# Patient Record
Sex: Male | Born: 1975 | Race: White | Hispanic: No | Marital: Married | Smoking: Never smoker
Health system: Southern US, Community
[De-identification: ages and names within clinical notes are randomized; demographics above are authoritative.]

## PROBLEM LIST (undated history)

## (undated) HISTORY — PX: TONSILLECTOMY: SUR1361

## (undated) HISTORY — PX: WISDOM TOOTH EXTRACTION: SHX21

---
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
## 1998-03-24 ENCOUNTER — Inpatient Hospital Stay (HOSPITAL_COMMUNITY): Admission: EM | Admit: 1998-03-24 | Discharge: 1998-03-24 | Payer: Self-pay

## 1998-03-24 ENCOUNTER — Inpatient Hospital Stay (HOSPITAL_COMMUNITY): Admission: AD | Admit: 1998-03-24 | Discharge: 1998-03-25 | Payer: Self-pay | Admitting: Psychiatry

## 2000-01-01 HISTORY — PX: OTHER SURGICAL HISTORY: SHX169

## 2001-05-30 ENCOUNTER — Ambulatory Visit (HOSPITAL_COMMUNITY): Admission: RE | Admit: 2001-05-30 | Discharge: 2001-05-30 | Payer: Self-pay | Admitting: Urology

## 2005-06-04 ENCOUNTER — Emergency Department (HOSPITAL_COMMUNITY): Admission: EM | Admit: 2005-06-04 | Discharge: 2005-06-05 | Payer: Self-pay | Admitting: Emergency Medicine

## 2012-09-24 ENCOUNTER — Telehealth (INDEPENDENT_AMBULATORY_CARE_PROVIDER_SITE_OTHER): Payer: Self-pay

## 2012-09-24 NOTE — Telephone Encounter (Signed)
Called [facility] to request medical records on patient for upcoming appointment on 09/25/12 @ 9:45 w/Dr. Andrey Campanile.  Medical records will fax to our office

## 2012-09-25 ENCOUNTER — Ambulatory Visit (INDEPENDENT_AMBULATORY_CARE_PROVIDER_SITE_OTHER): Payer: PRIVATE HEALTH INSURANCE | Admitting: General Surgery

## 2012-09-25 ENCOUNTER — Encounter (INDEPENDENT_AMBULATORY_CARE_PROVIDER_SITE_OTHER): Payer: Self-pay | Admitting: General Surgery

## 2012-09-25 VITALS — BP 114/85 | HR 80 | Temp 98.0°F | Resp 14 | Ht 69.0 in | Wt 184.8 lb

## 2012-09-25 DIAGNOSIS — R1031 Right lower quadrant pain: Secondary | ICD-10-CM

## 2012-09-25 DIAGNOSIS — M549 Dorsalgia, unspecified: Secondary | ICD-10-CM

## 2012-09-25 NOTE — Patient Instructions (Signed)
We will refer you to [facility]   Inguinal Strain Your exam shows you have an inguinal strain. This is also known as a pulled groin. This injury is usually due to a pull or partial tear to a muscle or tendon in the groin area. Most groin pulls take several weeks to heal completely. There may be pain with lifting your leg or walking during much of your recovery. Treatment for groin strains includes:  Rest and avoid lifting or performing activities that increase your pain.  Apply ice packs for 20-30 minutes every few hours to reduce pain and swelling over the next 2-3 days.  Medicine to reduce pain and inflammation is often prescribed. HOME CARE INSTRUCTIONS  While most strains in the groin area will heal with rest, you should also watch for any signs of a more serious condition.  SEEK IMMEDIATE MEDICAL CARE IF:   You notice unusual swelling or bulging in the groin.  You have pain or swelling in the testicle.  Blood in your urine.  Marked increased pain.  Weakness or numbness of your leg or abdominal pain. MAKE SURE YOU:   Understand these instructions.  Will watch your condition.  Will get help right away if you are not doing well or get worse. Document Released: 04/26/2004 Document Revised: 06/11/2011 Document Reviewed: 07/24/2007 [facility] Patient Information 2014 [place], [place].

## 2012-09-25 NOTE — Progress Notes (Signed)
Patient ID: Perry Cooper, male   DOB: [birth year], 37 y.o.   MRN: [id number]  Chief Complaint  Patient presents with  . New Evaluation    eval RIH    HPI Perry Cooper is a 37 y.o. male.   HPI 37 year old Caucasian male referred by Dr. Barbaraann Barthel for evaluation of right groin pain. The patient states that he was actually referred about a month ago but he has been putting it off. He states his problems began actually about 3 months ago now when he was at the gym working out. He is an amateur bodybuilder. He states he was doing a dead lift when he felt acute pain in his right lower back. He described it as a popping sensation in his right lower back. He immediately developed right groin pain as well as right lower leg numbness on his anterior medial thigh radiating down to his ankle. He thought that he had pulled a muscle or injured his sciatic nerve and he saw a chiropractor on several occasions without any relief. During this time he continued to go to the gym although he could not lift as much. He eventually saw his primary care physician in mid-May he put him on a prednisone taper and it felt at that time that he may have a right inguinal hernia. He states that the prednisone helped a little bit. However he is still symptomatic. He describes it a pushing sensation in his right groin. He states it is also a burning and stinging sensation as well. He denies noticing a bulge. He also complains of ongoing right lower back and hip pain as well. He states this is particularly bothersome when he tries to do any type of lifting. He states that he cannot do any squats or leg presses because of discomfort in his right hip and right groin area. He denies any dysuria. He denies any fever, chills, diarrhea or constipation. He still has some numbness as well as weakness in his right lower extremity. He states that he really hasn't done any strenuous activity or weightlifting in the past 2 weeks.    History reviewed. No pertinent past medical history.  Past Surgical History  Procedure Laterality Date  . Inner ear surgery Left 12/1984  . Testicular   01/2000    Family History  Problem Relation Age of Onset  . Cancer Paternal Grandfather     Colon    Social History History  Substance Use Topics  . Smoking status: Never Smoker   . Smokeless tobacco: Not on file  . Alcohol Use: Yes     Comment: Once a month    No Known Allergies  Current Outpatient Prescriptions  Medication Sig Dispense Refill  . naproxen (NAPROSYN) 500 MG tablet Take 500 mg by mouth 2 (two) times daily with a meal.       No current facility-administered medications for this visit.    Review of Systems Review of Systems  Constitutional: Negative for fever, chills, appetite change and unexpected weight change.  HENT: Negative for congestion and trouble swallowing.   Eyes: Negative for visual disturbance.  Respiratory: Negative for chest tightness and shortness of breath.   Cardiovascular: Negative for chest pain and leg swelling.       No PND, no orthopnea, no DOE  Gastrointestinal:       See HPI  Genitourinary: Positive for flank pain. Negative for dysuria, urgency, hematuria, decreased urine volume, discharge, scrotal swelling and difficulty urinating.  Musculoskeletal: Positive for back  pain.       See hpi  Skin: Negative for rash.  Neurological: Positive for numbness. Negative for dizziness, seizures, facial asymmetry and speech difficulty.  Hematological: Does not bruise/bleed easily.  Psychiatric/Behavioral: Negative for behavioral problems and confusion.    Blood pressure 114/85, pulse 80, temperature 98 F (36.7 C), temperature source Temporal, resp. rate 14, height 5\' 9"  (1.753 m), weight 184 lb 12.8 oz (83.825 kg), SpO2 98.00%.  Physical Exam Physical Exam  Constitutional: He is oriented to person, place, and time. He appears well-developed and well-nourished. No distress.   Muscular  HENT:  Head: Normocephalic and atraumatic.  Right Ear: External ear normal.  Left Ear: External ear normal.  Eyes: Conjunctivae are normal. No scleral icterus.  Neck: Normal range of motion. Neck supple. No tracheal deviation present. No thyromegaly present.  Cardiovascular: Normal rate, regular rhythm and normal heart sounds.   Pulmonary/Chest: Effort normal and breath sounds normal. No respiratory distress. He has no wheezes.  Abdominal: Soft. He exhibits no distension. There is no tenderness. There is no rebound and no guarding. Hernia confirmed negative in the right inguinal area and confirmed negative in the left inguinal area.  Genitourinary: Penis normal.    Right testis is descended. Left testis is descended. No penile tenderness.  Pt examined supine/standing with & without valsalva maneuvers; supine with leg raise, supine with abd situp - no bulge, no hernia on left or right; symmetric; no significant bulge with palpation of inguinal ring by myself or Dr Derrell Lolling; some minor pubic hair irritation from trimming area- no cellulitis  Musculoskeletal: He exhibits no edema and no tenderness.  Strength for the most part is symmetric although there may be a subtle weakness in the right lower extremity compared to left lower extremity  Lymphadenopathy:       Right: No inguinal adenopathy present.       Left: No inguinal adenopathy present.  Neurological: He is alert and oriented to person, place, and time.  Skin: Skin is warm and dry. No rash noted. He is not diaphoretic. No erythema.  Psychiatric: He has a normal mood and affect. His behavior is normal. Judgment and thought content normal.    Data Reviewed Dr Barbaraann Barthel office note  Assessment    Right groin pain Right lower back and hip pain     Plan    I did not appreciate an inguinal hernia on exam. I had one of my partners Dr. Derrell Lolling also examine the patient as well and he did not appreciate an inguinal hernia as  well. However it is possible he may have a very small early hernia. Nonetheless an inguinal hernia does not produce lower back pain with numbness down the lower extremity. He more than likely has inguinal strain in addition to some type of lower back versus hip issue. The patient was given information about inguinal strain. We discussed the importance of avoiding heavy lifting, repetitive motion, ice packs, and NSAIDs. We will refer the patient to [facility] orthopedics for evaluation as well with respect to his right lower back and hip pain with numbness down his lower extremity. F/u prn  Mary Sella. Andrey Campanile, MD, FACS General, Bariatric, & Minimally Invasive Surgery [facility], [place]         [facility] 09/25/2012, 11:05 AM

## 2012-09-30 ENCOUNTER — Ambulatory Visit (INDEPENDENT_AMBULATORY_CARE_PROVIDER_SITE_OTHER): Payer: Managed Care, Other (non HMO) | Admitting: General Surgery

## 2016-04-14 ENCOUNTER — Emergency Department (HOSPITAL_COMMUNITY)
Admission: EM | Admit: 2016-04-14 | Discharge: 2016-04-14 | Disposition: A | Discharge status: Home / Self Care | Payer: BLUE CROSS/BLUE SHIELD | Attending: Emergency Medicine | Admitting: Emergency Medicine

## 2016-04-14 ENCOUNTER — Encounter (HOSPITAL_COMMUNITY): Payer: Self-pay | Admitting: Emergency Medicine

## 2016-04-14 ENCOUNTER — Emergency Department (HOSPITAL_COMMUNITY): Payer: BLUE CROSS/BLUE SHIELD

## 2016-04-14 DIAGNOSIS — M7989 Other specified soft tissue disorders: Secondary | ICD-10-CM

## 2016-04-14 DIAGNOSIS — M546 Pain in thoracic spine: Secondary | ICD-10-CM | POA: Diagnosis present

## 2016-04-14 DIAGNOSIS — M799 Soft tissue disorder, unspecified: Secondary | ICD-10-CM | POA: Diagnosis not present

## 2016-04-14 MED ORDER — IBUPROFEN 800 MG PO TABS: 800.0000 mg | ORAL_TABLET | Freq: Three times a day (TID) | ORAL | 0 refills | Status: AC | PRN

## 2016-04-14 MED ORDER — METHOCARBAMOL 500 MG PO TABS: 500.0000 mg | ORAL_TABLET | Freq: Four times a day (QID) | ORAL | 0 refills | Status: AC | PRN

## 2016-04-14 NOTE — Discharge Instructions (Signed)
Read the information below.  Use the prescribed medication as directed.  Please discuss all new medications with your pharmacist.  You may return to the Emergency Department at any time for worsening condition or any new symptoms that concern you.    °

## 2016-04-14 NOTE — ED Triage Notes (Signed)
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 Pt noted decreased ROM of upper arms in lat 2 days.C/o tingling and numbness in both arms yesterday. Scheduled to be seen by PCP in 3 days. Took one Flexeril last night with some relief

## 2016-04-14 NOTE — ED Provider Notes (Signed)
[facility]-EMERGENCY DEPT Provider Note   CSN: [id number] Arrival date & time: 04/14/16  1108  By signing my name below, I, Perry Cooper, attest that this documentation has been prepared under the direction and in the presence of [facility], PA-C.  Electronically Signed: Octavia Cooper, ED Scribe. 04/14/16. 12:28 PM.    History   Chief Complaint Chief Complaint  Patient presents with  . Back Pain    The history is provided by the patient. No language interpreter was used.   HPI Comments: Perry Cooper is a 41 y.o. male who presents to the Emergency Department complaining of moderate, gradual worsening, upper thoracic back pain x 1.5 months. Pt notes his pain became progressively worse last night. He describes the pain as sharp and feels as if it is "bulged". Pt notes intermittent numbness and tingling in his bilateral upper extremities (R>L) yesterday. He expresses more weakness in his right arm when it is lifted that radiates and causes pain in his neck. 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 He has a hx of a herniated L4 that had with no surgical intervention but modified with physical therapy. He has taken flexeril to alleviate his pain with moderate relief. He denies lower extremity pain, fever, chills, bladder or bowel incontinence, hx of cancer, or hx of IV drug use.  History reviewed. No pertinent past medical history.  There are no active problems to display for this patient.   Past Surgical History:  Procedure Laterality Date  . INNER EAR SURGERY Left 12/1984  . testicular   01/2000  . TONSILLECTOMY    . WISDOM TOOTH EXTRACTION         Home Medications    Prior to Admission medications   Medication Sig Start Date End Date Taking? Authorizing Provider  ibuprofen (ADVIL,MOTRIN) 800 MG tablet Take 1 tablet (800 mg total) by mouth every 8 (eight) hours as needed for mild pain or moderate pain. 04/14/16   Trixie Dredge, PA-C    methocarbamol (ROBAXIN) 500 MG tablet Take 1-2 tablets (500-1,000 mg total) by mouth every 6 (six) hours as needed for muscle spasms (or pain). 04/14/16   Trixie Dredge, PA-C  naproxen (NAPROSYN) 500 MG tablet Take 500 mg by mouth 2 (two) times daily with a meal.    Historical Provider, MD    Family History Family History  Problem Relation Age of Onset  . Heart attack Mother   . Pulmonary fibrosis Father   . Cancer Paternal Grandfather     Colon    Social History Social History  Substance Use Topics  . Smoking status: Never Smoker  . Smokeless tobacco: Never Used  . Alcohol use Yes     Comment: Once a month     Allergies   Patient has no known allergies.   Review of Systems Review of Systems  Constitutional: Negative for chills and fever.  Musculoskeletal: Positive for back pain.  Skin: Negative for color change and wound.  Allergic/Immunologic: Negative for immunocompromised state.  Neurological: Positive for weakness (R>L) and numbness.  Psychiatric/Behavioral: Negative for self-injury.     Physical Exam Updated Vital Signs BP 145/84 (BP Location: Right Arm)   Pulse 75   Temp 98.7 F (37.1 C) (Oral)   Resp 18   Wt 90.3 kg   SpO2 96%   BMI 29.39 kg/m   Physical Exam  Constitutional: He appears well-developed and well-nourished. No distress.  HENT:  Head: Normocephalic and atraumatic.  Neck: Neck  supple.  Pulmonary/Chest: Effort normal.  Musculoskeletal: He exhibits tenderness.  Spine non tender with exception of soft tender mass overlying upper thoracic spine, no erythema or warmth, mild tenderness of the bilateral trapezius. Upper extremities:  Strength 5/5, sensation intact, distal pulses intact.     Neurological: He is alert.  Skin: He is not diaphoretic.  Nursing note and vitals reviewed.    ED Treatments / Results  DIAGNOSTIC STUDIES: Oxygen Saturation is 100% on RA, normal by my interpretation.  COORDINATION OF CARE:  12:21 PM Discussed  treatment plan with pt at bedside and pt agreed to plan.  Labs (all labs ordered are listed, but only abnormal results are displayed) Labs Reviewed - No data to display  EKG  EKG Interpretation None       Radiology Dg Cervical Spine Complete  Result Date: 04/14/2016 CLINICAL DATA:  Bulge/knot noticed mid neck/back at about C7/T1; noticed about 1.5 months ago. Nagging pain for 1.5 months, worsening last night. No known injury. Pt is very muscular and states that he lifts weights a lot.No previous injury EXAM: CERVICAL SPINE - COMPLETE 4+ VIEW COMPARISON:  None. FINDINGS: No fracture.  No spondylolisthesis.  No bone lesion. Mild moderate loss of disc height at C5-C6 with small endplate osteophytes. Remaining disc spaces are well preserved. Mild right neural foraminal narrowing at C5-C6 from uncovertebral spurring. Remaining neural foramina are well preserved. Soft tissues are unremarkable. IMPRESSION: 1. No fracture or acute finding. 2. 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 Electronically Signed   By: Amie Portlandavid  Ormond M.D.   On: 04/14/2016 12:55   Dg Thoracic Spine 2 View  Result Date: 04/14/2016 CLINICAL DATA:  Bulge/knot noticed mid neck/back at about C7/T1; noticed about 1.5 months ago. Nagging pain for 1.5 months, worsening last night. No known injury. Pt is very muscular and states that he lifts weights a lot. No previous injury EXAM: THORACIC SPINE 2 VIEWS COMPARISON:  None. FINDINGS: There is no evidence of thoracic spine fracture. Alignment is normal. No other significant bone abnormalities are identified. IMPRESSION: Negative. Electronically Signed   By: Amie Portlandavid  Ormond M.D.   On: 04/14/2016 12:56    Procedures Procedures (including critical care time)  Medications Ordered in ED Medications - No data to display   Initial Impression / Assessment and Plan / ED Course  I have reviewed the triage vital signs and the nursing notes.  Pertinent labs & imaging results that were available  during my care of the patient were reviewed by me and considered in my medical decision making (see chart for details).  Clinical Course    Afebrile, nontoxic patient with painful soft tissue mass over upper thoracic spine.  Xrays negative.  Neurovascularly intact.  Pt also seen by Dr Criss AlvineGoldston.  Suspect soft tissue mass, possibly lipoma.  Xrays without concerning finding . There is degenerative change C5-C6, unrelated.  D/C home with symptomatic medications, PCP, general surgery referral.   Discussed result, findings, treatment, and follow up  with patient.  Pt given return precautions.  Pt verbalizes understanding and agrees with plan.       Final Clinical Impressions(s) / ED Diagnoses   Final diagnoses:  Soft tissue mass   I personally performed the services described in this documentation, which was scribed in my presence. The recorded information has been reviewed and is accurate.  New Prescriptions Discharge Medication List as of 04/14/2016  1:28 PM    START taking these medications   Details  ibuprofen (ADVIL,MOTRIN) 800 MG tablet Take 1  tablet (800 mg total) by mouth every 8 (eight) hours as needed for mild pain or moderate pain., Starting Sat 04/14/2016, Print    methocarbamol (ROBAXIN) 500 MG tablet Take 1-2 tablets (500-1,000 mg total) by mouth every 6 (six) hours as needed for muscle spasms (or pain)., Starting Sat 04/14/2016, Print         [place], [place] 04/14/16 1354    Pricilla Loveless, MD 04/17/16 [phone]

## 2016-04-20 ENCOUNTER — Other Ambulatory Visit: Payer: Self-pay | Admitting: General Surgery

## 2016-04-20 DIAGNOSIS — M549 Dorsalgia, unspecified: Secondary | ICD-10-CM

## 2016-05-01 ENCOUNTER — Ambulatory Visit
Admission: RE | Admit: 2016-05-01 | Discharge: 2016-05-01 | Disposition: A | Discharge status: Home / Self Care | Payer: BLUE CROSS/BLUE SHIELD | Source: Ambulatory Visit | Attending: General Surgery | Admitting: General Surgery

## 2016-05-01 DIAGNOSIS — M549 Dorsalgia, unspecified: Secondary | ICD-10-CM

## 2016-05-01 MED ORDER — GADOBENATE DIMEGLUMINE 529 MG/ML IV SOLN
19.0000 mL | Freq: Once | INTRAVENOUS | Status: AC | PRN
  Administered 2016-05-01: 19 mL via INTRAVENOUS

## 2016-05-29 ENCOUNTER — Other Ambulatory Visit: Payer: Self-pay | Admitting: Endocrinology

## 2016-05-29 DIAGNOSIS — E291 Testicular hypofunction: Secondary | ICD-10-CM

## 2016-06-06 ENCOUNTER — Inpatient Hospital Stay
Admission: RE | Admit: 2016-06-06 | Discharge: 2016-06-06 | Disposition: A | Discharge status: Home / Self Care | Payer: BLUE CROSS/BLUE SHIELD | Source: Ambulatory Visit | Attending: Endocrinology | Admitting: Endocrinology

## 2016-06-06 ENCOUNTER — Ambulatory Visit
Admission: RE | Admit: 2016-06-06 | Discharge: 2016-06-06 | Disposition: A | Discharge status: Home / Self Care | Payer: BLUE CROSS/BLUE SHIELD | Source: Ambulatory Visit | Attending: Endocrinology | Admitting: Endocrinology

## 2016-06-06 DIAGNOSIS — E291 Testicular hypofunction: Secondary | ICD-10-CM

## 2016-06-06 MED ORDER — GADOBENATE DIMEGLUMINE 529 MG/ML IV SOLN
10.0000 mL | Freq: Once | INTRAVENOUS | Status: AC | PRN
  Administered 2016-06-06: 10 mL via INTRAVENOUS

## 2016-11-08 ENCOUNTER — Encounter (HOSPITAL_COMMUNITY): Payer: Self-pay | Admitting: Emergency Medicine

## 2016-11-08 DIAGNOSIS — R58 Hemorrhage, not elsewhere classified: Secondary | ICD-10-CM | POA: Diagnosis not present

## 2016-11-08 DIAGNOSIS — R1031 Right lower quadrant pain: Secondary | ICD-10-CM | POA: Insufficient documentation

## 2016-11-08 NOTE — ED Triage Notes (Signed)
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 Felt a pop above his genitals Tuesday evening and now has swelling and bruising around his genitals. Alert and oriented.

## 2016-11-09 ENCOUNTER — Emergency Department (HOSPITAL_COMMUNITY): Payer: BLUE CROSS/BLUE SHIELD

## 2016-11-09 ENCOUNTER — Encounter (HOSPITAL_COMMUNITY): Payer: Self-pay

## 2016-11-09 ENCOUNTER — Emergency Department (HOSPITAL_COMMUNITY)
Admission: EM | Admit: 2016-11-09 | Discharge: 2016-11-09 | Disposition: A | Discharge status: Home / Self Care | Payer: BLUE CROSS/BLUE SHIELD | Attending: Emergency Medicine | Admitting: Emergency Medicine

## 2016-11-09 DIAGNOSIS — R1031 Right lower quadrant pain: Secondary | ICD-10-CM

## 2016-11-09 DIAGNOSIS — R58 Hemorrhage, not elsewhere classified: Secondary | ICD-10-CM

## 2016-11-09 DIAGNOSIS — T148XXA Other injury of unspecified body region, initial encounter: Secondary | ICD-10-CM

## 2016-11-09 DIAGNOSIS — R609 Edema, unspecified: Secondary | ICD-10-CM

## 2016-11-09 LAB — CBC WITH DIFFERENTIAL/PLATELET
BASOS PCT: 0 %
Basophils Absolute: 0 10*3/uL (ref 0.0–0.1)
EOS ABS: 0.2 10*3/uL (ref 0.0–0.7)
Eosinophils Relative: 3 %
HCT: 48.5 % (ref 39.0–52.0)
HEMOGLOBIN: 17.1 g/dL — AB (ref 13.0–17.0)
LYMPHS ABS: 1.8 10*3/uL (ref 0.7–4.0)
Lymphocytes Relative: 27 %
MCH: 32.1 pg (ref 26.0–34.0)
MCHC: 35.3 g/dL (ref 30.0–36.0)
MCV: 91.2 fL (ref 78.0–100.0)
Monocytes Absolute: 0.7 10*3/uL (ref 0.1–1.0)
Monocytes Relative: 11 %
NEUTROS PCT: 59 %
Neutro Abs: 3.9 10*3/uL (ref 1.7–7.7)
Platelets: 196 10*3/uL (ref 150–400)
RBC: 5.32 MIL/uL (ref 4.22–5.81)
RDW: 13.1 % (ref 11.5–15.5)
WBC: 6.7 10*3/uL (ref 4.0–10.5)

## 2016-11-09 LAB — PROTIME-INR
INR: 1.12
PROTHROMBIN TIME: 14.4 s (ref 11.4–15.2)

## 2016-11-09 LAB — APTT: aPTT: 28 seconds (ref 24–36)

## 2016-11-09 LAB — COMPREHENSIVE METABOLIC PANEL
ALBUMIN: 3.9 g/dL (ref 3.5–5.0)
ALK PHOS: 66 U/L (ref 38–126)
ALT: 33 U/L (ref 17–63)
AST: 29 U/L (ref 15–41)
Anion gap: 9 (ref 5–15)
BUN: 11 mg/dL (ref 6–20)
CALCIUM: 9 mg/dL (ref 8.9–10.3)
CO2: 25 mmol/L (ref 22–32)
CREATININE: 1.02 mg/dL (ref 0.61–1.24)
Chloride: 105 mmol/L (ref 101–111)
GFR calc Af Amer: >60 mL/min (ref >60)
GFR calc non Af Amer: >60 mL/min (ref >60)
GLUCOSE: 117 mg/dL — AB (ref 65–99)
Potassium: 4.1 mmol/L (ref 3.5–5.1)
SODIUM: 139 mmol/L (ref 135–145)
Total Bilirubin: 0.8 mg/dL (ref 0.3–1.2)
Total Protein: 6.7 g/dL (ref 6.5–8.1)

## 2016-11-09 MED ORDER — IOPAMIDOL (ISOVUE-300) INJECTION 61%
INTRAVENOUS | Status: AC
Start: 2016-11-09 — End: 2016-11-09
  Administered 2016-11-09: 100 mL via INTRAVENOUS
  Filled 2016-11-09: qty 100

## 2016-11-09 MED ORDER — IBUPROFEN 800 MG PO TABS
800.0000 mg | ORAL_TABLET | Freq: Once | ORAL | Status: AC
  Administered 2016-11-09: 800 mg via ORAL

## 2016-11-09 MED ORDER — IOPAMIDOL (ISOVUE-300) INJECTION 61%
100.0000 mL | Freq: Once | INTRAVENOUS | Status: AC | PRN
  Administered 2016-11-09: 100 mL via INTRAVENOUS

## 2016-11-09 MED ORDER — IBUPROFEN 800 MG PO TABS
ORAL_TABLET | ORAL | Status: AC
  Filled 2016-11-09: qty 1

## 2016-11-09 NOTE — ED Notes (Addendum)
Pt reports he was playing football with his sons Tuesday when he was running to catch the ball and turned, heard a "pop" from his R groin area.  Bruising noted on his groin area and on his testicles.  Pt reports he is having a hard time with R leg abduction d/t pain.  Pt also reports mild weakness. Pt ambulatory

## 2016-11-09 NOTE — ED Notes (Signed)
Patient transported to CT 

## 2016-11-09 NOTE — ED Notes (Signed)
US at bedside

## 2016-11-09 NOTE — ED Provider Notes (Signed)
[facility]-EMERGENCY DEPT Provider Note   CSN: [id number] Arrival date & time: 11/08/16  1824     History   Chief Complaint Chief Complaint  Patient presents with  . Groin Pain    HPI Perry Cooper is a 41 y.o. male with a hx of testicular torsion, presents to the Emergency Department complaining of acute, persistent right groin pain onset approximately 3 days ago. Patient reports that he was running and turned to catch a ball when he felt something pop just above his genitals. He reports that he is torn numerous muscles in the past and this felt the same however he felt that the swelling and bruising was worse than usual. He adamantly denies blunt trauma, injury during sexual intercourse, MVA or fall.  Patient reports that walking is painful but Advil significantly improves his pain. He denies additional treatments prior to arrival. He does not currently have an orthopedist in the area. He denies penile testicular pain, hematuria.   The history is provided by the patient and medical records. No language interpreter was used.    History reviewed. No pertinent past medical history.  There are no active problems to display for this patient.   Past Surgical History:  Procedure Laterality Date  . INNER EAR SURGERY Left 12/1984  . testicular   01/2000  . TONSILLECTOMY    . WISDOM TOOTH EXTRACTION         Home Medications    Prior to Admission medications   Medication Sig Start Date End Date Taking? Authorizing Provider  ibuprofen (ADVIL,MOTRIN) 800 MG tablet Take 1 tablet (800 mg total) by mouth every 8 (eight) hours as needed for mild pain or moderate pain. Patient not taking: Reported on 11/09/2016 04/14/16   Trixie Dredge, PA-C  methocarbamol (ROBAXIN) 500 MG tablet Take 1-2 tablets (500-1,000 mg total) by mouth every 6 (six) hours as needed for muscle spasms (or pain). Patient not taking: Reported on 11/09/2016 04/14/16   Trixie Dredge, PA-C    Family History Family History    Problem Relation Age of Onset  . Heart attack Mother   . Pulmonary fibrosis Father   . Cancer Paternal Grandfather        Colon    Social History Social History  Substance Use Topics  . Smoking status: Never Smoker  . Smokeless tobacco: Never Used  . Alcohol use Yes     Comment: Once a month     Allergies   Patient has no known allergies.   Review of Systems Review of Systems  Constitutional: Negative for appetite change, diaphoresis, fatigue, fever and unexpected weight change.  HENT: Negative for mouth sores.   Eyes: Negative for visual disturbance.  Respiratory: Negative for cough, chest tightness, shortness of breath and wheezing.   Cardiovascular: Negative for chest pain.  Gastrointestinal: Positive for abdominal pain. Negative for constipation, diarrhea, nausea and vomiting.  Endocrine: Negative for polydipsia, polyphagia and polyuria.  Genitourinary: Negative for dysuria, frequency, hematuria and urgency.  Musculoskeletal: Positive for arthralgias and myalgias. Negative for back pain and neck stiffness.  Skin: Negative for rash.  Allergic/Immunologic: Negative for immunocompromised state.  Neurological: Negative for syncope, light-headedness and headaches.  Hematological: Does not bruise/bleed easily.  Psychiatric/Behavioral: Negative for sleep disturbance. The patient is not nervous/anxious.   All other systems reviewed and are negative.    Physical Exam Updated Vital Signs BP 128/81 (BP Location: Left Arm)   Pulse (!) 59   Temp 98.2 F (36.8 C) (Oral)   Resp 18  SpO2 100%   Physical Exam  Constitutional: He appears well-developed and well-nourished. No distress.  Awake, alert, nontoxic appearance  HENT:  Head: Normocephalic and atraumatic.  Mouth/Throat: Oropharynx is clear and moist. No oropharyngeal exudate.  Eyes: Conjunctivae are normal. No scleral icterus.  Neck: Normal range of motion. Neck supple.  Cardiovascular: Normal rate, regular  rhythm and intact distal pulses.   Pulmonary/Chest: Effort normal and breath sounds normal. No respiratory distress. He has no wheezes.  Equal chest expansion  Abdominal: Soft. Bowel sounds are normal. He exhibits no mass. There is no tenderness. There is no rebound and no guarding. Hernia confirmed negative in the right inguinal area and confirmed negative in the left inguinal area.  Genitourinary: Right testis shows tenderness. Right testis shows no mass and no swelling. Right testis is descended. Cremasteric reflex is not absent on the right side. Left testis shows tenderness. Left testis shows no mass and no swelling. Left testis is descended. Cremasteric reflex is not absent on the left side.  Genitourinary Comments: Some ecchymosis noted on the shaft of the penis and down into the scrotum. No deformity of the penis. No tenderness to palpation of the penis.  Mild tenderness to palpation of the bilateral testicles. No palpable inguinal hernias.  Musculoskeletal: Normal range of motion. He exhibits no edema.  BLE with full range of motion of the hips, knees and ankles. Significant ecchymosis noted to the inguinal region, mons pubis and streaking down the right thigh. Moderate tenderness to the right groin and mild tenderness to the right thigh  Neurological: He is alert.  Speech is clear and goal oriented Moves extremities without ataxia Right hip 5/5 strength with flexion, extension and abduction.  4/5 strength with adduction. Left hip 5/5 strength with flexion, extension, adduction and abduction Bilateral knees and ankles 5/5 strength. Sensation intact to the BLE  Skin: Skin is warm and dry. He is not diaphoretic.  Psychiatric: He has a normal mood and affect.  Nursing note and vitals reviewed.    ED Treatments / Results  Labs (all labs ordered are listed, but only abnormal results are displayed) Labs Reviewed  CBC WITH DIFFERENTIAL/PLATELET - Abnormal; Notable for the following:        Result Value   Hemoglobin 17.1 (*)    All other components within normal limits  COMPREHENSIVE METABOLIC PANEL - Abnormal; Notable for the following:    Glucose, Bld 117 (*)    All other components within normal limits  PROTIME-INR  APTT     Radiology US Scrotum  Result Date: 11/09/2016 CLINICAL DATA:  Felt pop in right groin while playing football, with swelling and bruising about the groin and scrotum. Initial encounter. EXAM: SCROTAL ULTRASOUND DOPPLER ULTRASOUND OF THE TESTICLES TECHNIQUE: Complete ultrasound examination of the testicles, epididymis, and other scrotal structures was performed. Color and spectral Doppler ultrasound were also utilized to evaluate blood flow to the testicles. COMPARISON:  CT of the abdomen and pelvis from 06/05/2005 FINDINGS: Right testicle Measurements: 3.5 x 1.7 x 2.2 cm. No mass or microlithiasis visualized. Left testicle Measurements: 3.2 x 2.2 x 2.2 cm. No mass or microlithiasis visualized. Right epididymis:  Normal in size and appearance. Left epididymis:  Normal in size and appearance. Hydrocele:  None visualized. Varicocele:  None visualized. Pulsed Doppler interrogation of both testes demonstrates normal low resistance arterial and venous waveforms bilaterally. IMPRESSION: Unremarkable scrotal ultrasound.  No evidence of testicular torsion. Electronically Signed   By: Roanna Raider M.D.   On: 11/09/2016  04:52   Ct Abdomen Pelvis W Contrast  Result Date: 11/09/2016 CLINICAL DATA:  Heard pop in right groin while playing football, with right groin bruising and weakness. Initial encounter. EXAM: CT ABDOMEN AND PELVIS WITH CONTRAST TECHNIQUE: Multidetector CT imaging of the abdomen and pelvis was performed using the standard protocol following bolus administration of intravenous contrast. CONTRAST:  100 mL of Isovue 300 IV contrast COMPARISON:  CT of the abdomen and pelvis performed 06/05/2005 FINDINGS: Lower chest: The visualized lung bases are grossly  clear. The visualized portions of the mediastinum are unremarkable. Hepatobiliary: The liver is unremarkable in appearance. The gallbladder is unremarkable in appearance. The common bile duct remains normal in caliber. Pancreas: The pancreas is within normal limits. Spleen: The spleen is unremarkable in appearance. Adrenals/Urinary Tract: The adrenal glands are unremarkable in appearance. The kidneys are within normal limits. There is no evidence of hydronephrosis. No renal or ureteral stones are identified. No perinephric stranding is seen. Stomach/Bowel: The stomach is unremarkable in appearance. The small bowel is within normal limits. The appendix is normal in caliber, without evidence of appendicitis. The colon is unremarkable in appearance. Vascular/Lymphatic: Minimal calcification is noted at the distal abdominal aorta and the right common iliac artery, mildly advanced for age. No retroperitoneal or pelvic sidewall lymphadenopathy is seen. Visualized mesenteric nodes are borderline normal in size. Reproductive: The bladder is mildly distended and grossly unremarkable. The prostate remains normal in size. Other: No inguinal hernia is seen. There is mild soft tissue inflammation at the right inguinal region, with additional focal edema noted within the anterior soft tissues just superior to the penile shaft. This raises concern for focal soft tissue injury. Musculoskeletal: No acute osseous abnormalities are identified. The visualized musculature is unremarkable in appearance. IMPRESSION: 1. Mild soft tissue inflammation at the right inguinal region, with additional focal edema at the anterior soft tissues just superior to the penile shaft. This raises concern for focal soft tissue injury. 2. No evidence of inguinal hernia. Electronically Signed   By: Roanna Raider M.D.   On: 11/09/2016 05:11   [place] Art/ven Flow Abd Pelv Doppler  Result Date: 11/09/2016 CLINICAL DATA:  Felt pop in right groin while playing  football, with swelling and bruising about the groin and scrotum. Initial encounter. EXAM: SCROTAL ULTRASOUND DOPPLER ULTRASOUND OF THE TESTICLES TECHNIQUE: Complete ultrasound examination of the testicles, epididymis, and other scrotal structures was performed. Color and spectral Doppler ultrasound were also utilized to evaluate blood flow to the testicles. COMPARISON:  CT of the abdomen and pelvis from 06/05/2005 FINDINGS: Right testicle Measurements: 3.5 x 1.7 x 2.2 cm. No mass or microlithiasis visualized. Left testicle Measurements: 3.2 x 2.2 x 2.2 cm. No mass or microlithiasis visualized. Right epididymis:  Normal in size and appearance. Left epididymis:  Normal in size and appearance. Hydrocele:  None visualized. Varicocele:  None visualized. Pulsed Doppler interrogation of both testes demonstrates normal low resistance arterial and venous waveforms bilaterally. IMPRESSION: Unremarkable scrotal ultrasound.  No evidence of testicular torsion. Electronically Signed   By: Roanna Raider M.D.   On: 11/09/2016 04:52    Procedures Procedures (including critical care time)  Medications Ordered in ED Medications  ibuprofen (ADVIL,MOTRIN) 800 MG tablet (not administered)  ibuprofen (ADVIL,MOTRIN) tablet 800 mg (800 mg Oral Given 11/09/16 0140)  iopamidol (ISOVUE-300) 61 % injection 100 mL (100 mLs Intravenous Contrast Given 11/09/16 0314)     Initial Impression / Assessment and Plan / ED Course  I have reviewed the triage vital signs  and the nursing notes.  Pertinent labs & imaging results that were available during my care of the patient were reviewed by me and considered in my medical decision making (see chart for details).  Clinical Course as of Nov 10 714  Fri Nov 09, 2016  0330 US scrotum: Right testicle is normal with color Doppler flow, arterial and venous waveform. Left testicle is normal with color Doppler flow arterial and venous waveform. No evidence of epididymitis or hydrocele  bilaterally.  [HM]    Clinical Course User Index [HM] Mathilde Mcwherter, Dahlia ClientHannah, [place] JerseyPA-C    Patient presents with injury to the right groin. Scrotum ultrasound without evidence of torsion. CT scan of pelvis shows soft tissue inflammation at the right inguinal region with additional focal edema at the anterior soft tissues just superior to the penile shaft. No evidence of inguinal hernia. Muscles are symmetric. No large hematoma or evidence of extravasation.  Discussed these findings with patient and concern for blunt injury. Patient is adamant about the mechanism of injury. He states he is sure that he is torn something. Repeat genital exam shows no evidence of penile fracture.  I recommended conservative treatment including Tylenol and ibuprofen along with close orthopedic follow-up.  Patient is to return to emergency department for development of penile pain, testicular pain, worsening bleeding, abdominal pain, nausea, vomiting or other concerns. Patient states understanding and agreement with the plan.  Final Clinical Impressions(s) / ED Diagnoses   Final diagnoses:  Right inguinal pain  Ecchymosis    New Prescriptions New Prescriptions   No medications on file     Milta DeitersMuthersbaugh, Slate Debroux, PA-C 11/09/16 0719    Ward, Layla MawKristen N, DO 11/09/16 [id number]

## 2016-11-09 NOTE — Discharge Instructions (Signed)
1. Medications: tylenol or ibuprofen for pain, usual home medications 2. Treatment: rest, drink plenty of fluids, use ice 3. Follow Up: Please followup with your primary doctor and ortho in 3-5 days for discussion of your diagnoses and further evaluation after today's visit; if you do not have a primary care doctor use the resource guide provided to find one; Please return to the ER for worsening symptoms, hematuria, progressive weakness or other concerns

## 2019-03-03 DEATH — deceased

## 2019-07-14 IMAGING — CT CT ABD-PELV W/ CM
2 of 4 series · 15 of 46 positions shown, 17 images · IV contrast (ISOVUE)
Comparison: CT of the abdomen and pelvis performed 06/05/2005

CLINICAL DATA: Heard pop in right groin while playing football,
with right groin bruising and weakness. Initial encounter.

EXAM:
CT ABDOMEN AND PELVIS WITH CONTRAST
TECHNIQUE: Multidetector CT imaging of the abdomen and pelvis was performed
using the standard protocol following bolus administration of
intravenous contrast.
CONTRAST:  100 mL of Isovue 300 IV contrast

[Series 2: abd/pel with · axial · 0.74mm/px · z∈[-612,-186]mm · 12 of 95 slices shown, 14 images]
[im 5/95  soft-tissue]
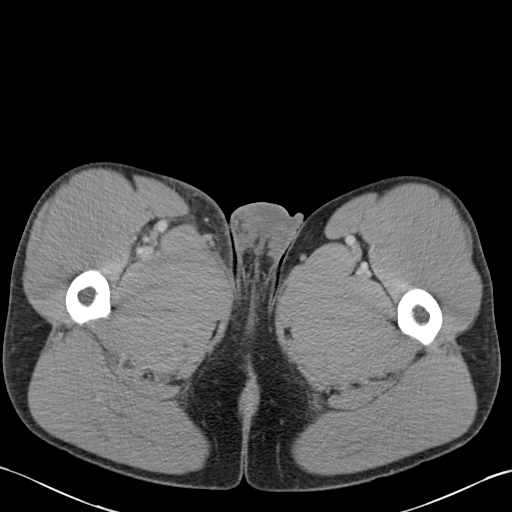
[im 5/95  bone]
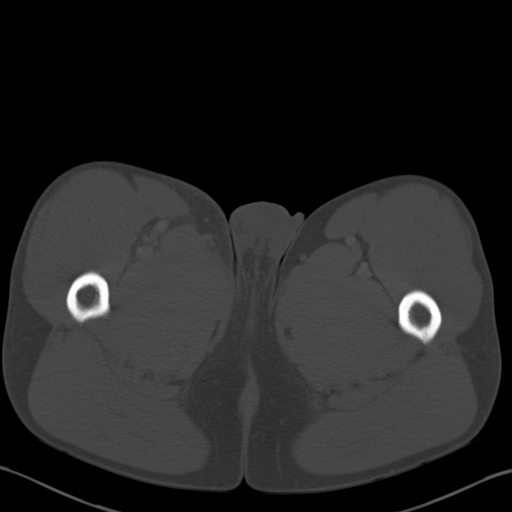
[im 14/95  soft-tissue]
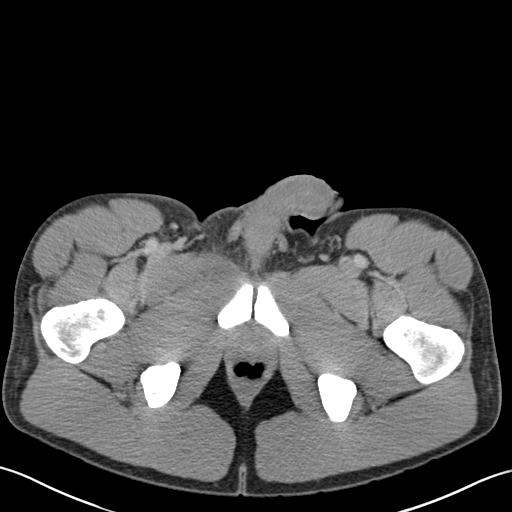
[im 23/95  soft-tissue]
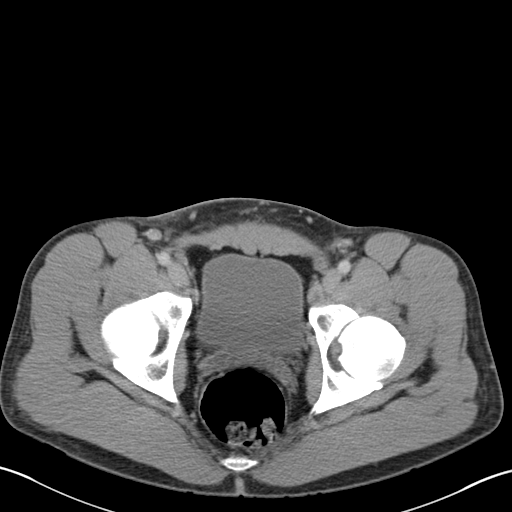
[im 27/95  soft-tissue]
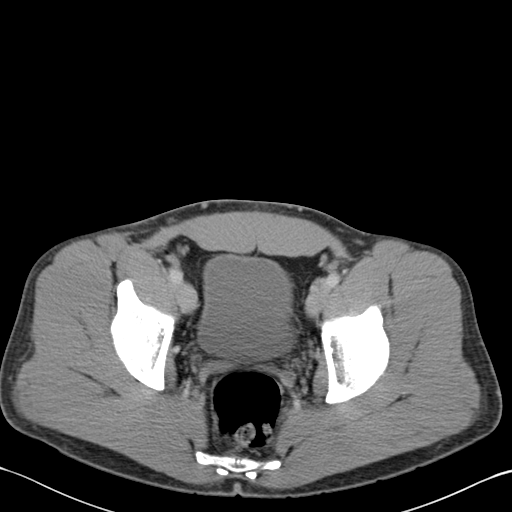
[im 36/95  soft-tissue]
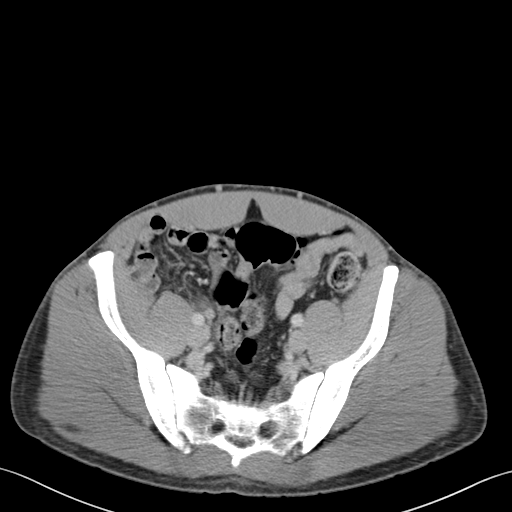
[im 45/95  soft-tissue]
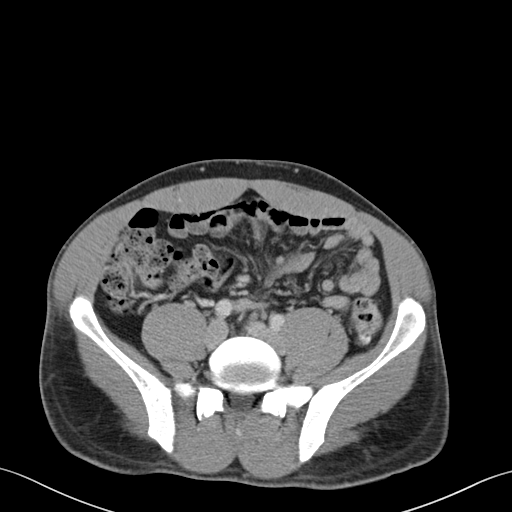
[im 50/95  soft-tissue]
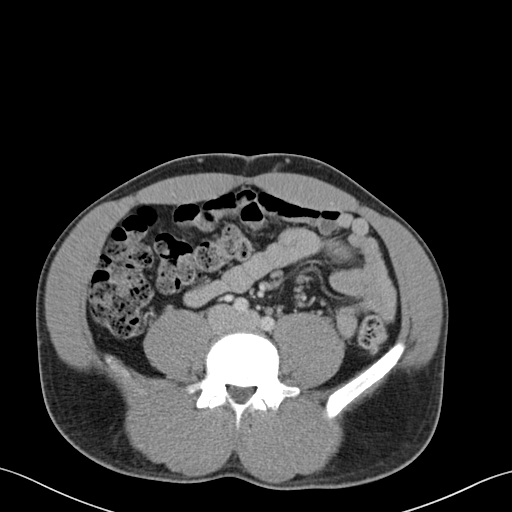
[im 59/95  soft-tissue]
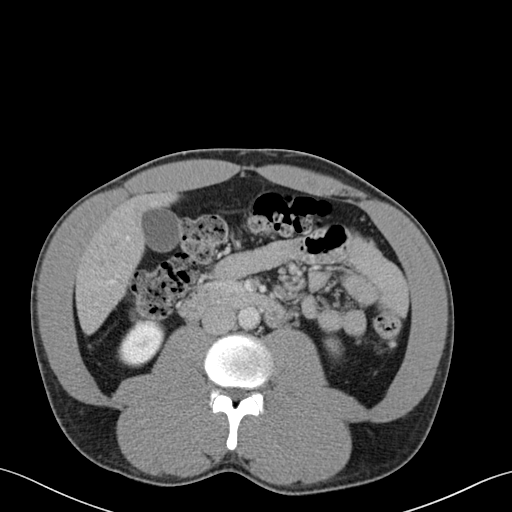
[im 68/95  soft-tissue]
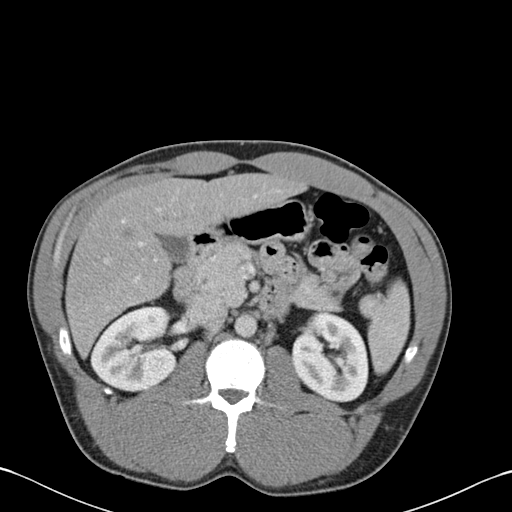
[im 68/95  bone]
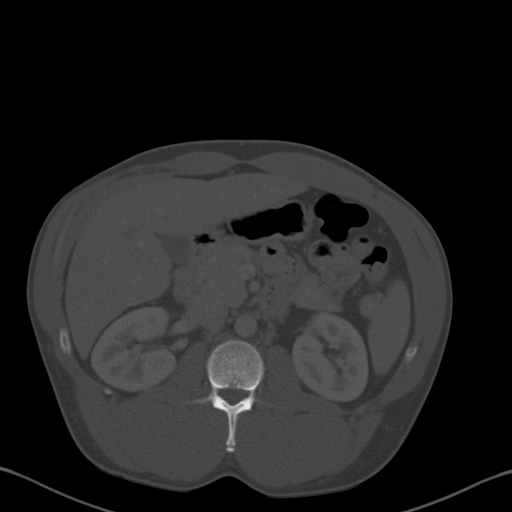
[im 72/95  soft-tissue]
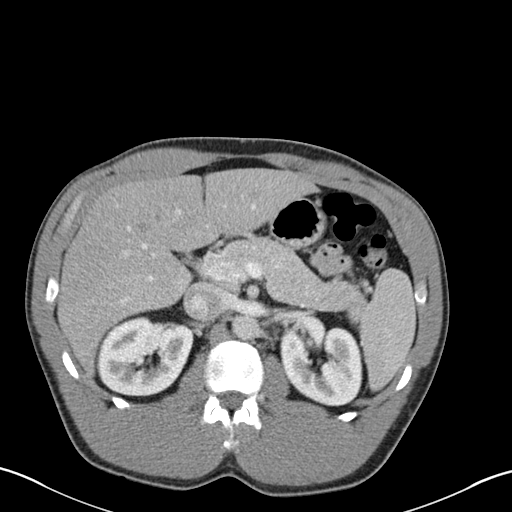
[im 81/95  soft-tissue]
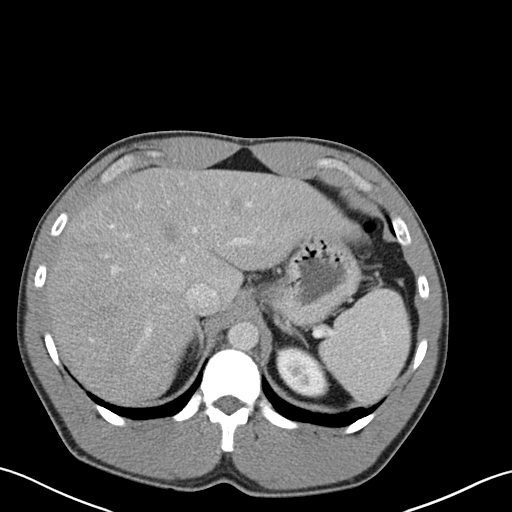
[im 90/95  soft-tissue]
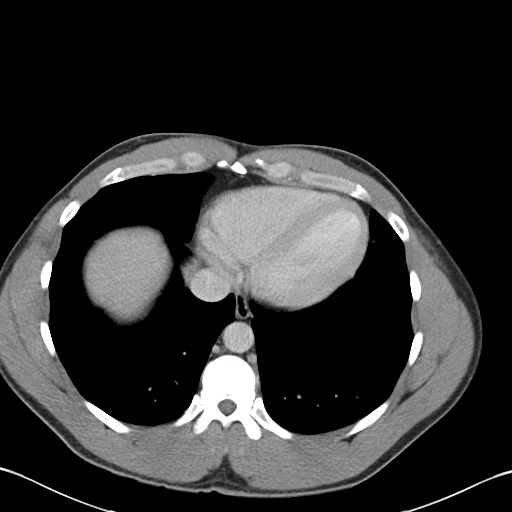

[Series 4: coronal a/|p · coronal · 0.67mm/px · 3 of 124 slices shown]
[im 42/124  soft-tissue]
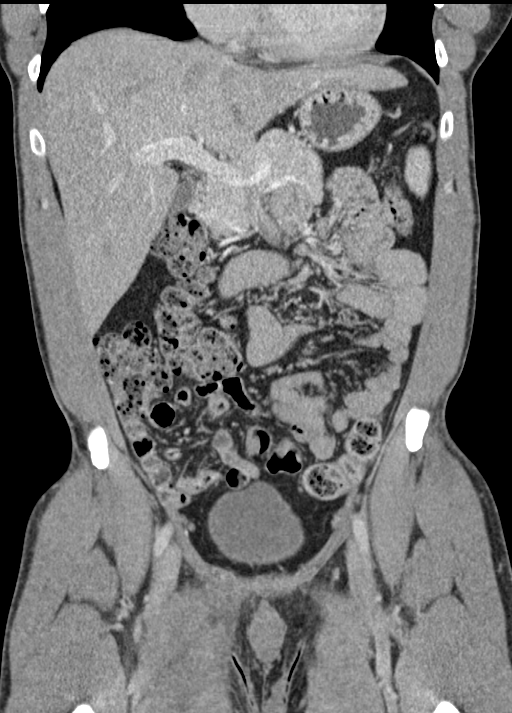
[im 55/124  soft-tissue]
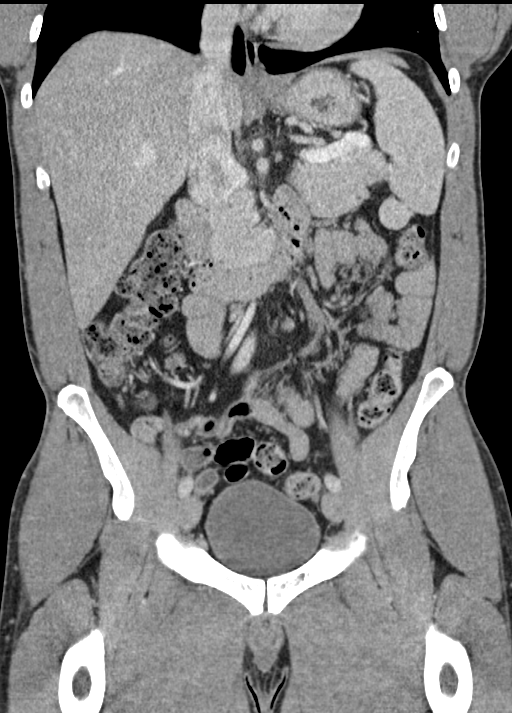
[im 69/124  soft-tissue]
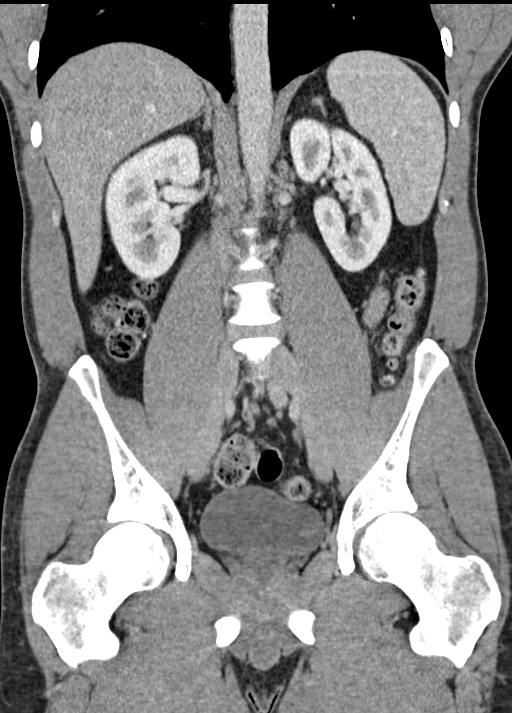

[15 of 46 positions shown; findings below may reference images not displayed]

FINDINGS: Lower chest: The visualized lung bases are grossly clear. The
visualized portions of the mediastinum are unremarkable.

Hepatobiliary: The liver is unremarkable in appearance. The
gallbladder is unremarkable in appearance. The common bile duct
remains normal in caliber.

Pancreas: The pancreas is within normal limits.

Spleen: The spleen is unremarkable in appearance.

Adrenals/Urinary Tract: The adrenal glands are unremarkable in
appearance. The kidneys are within normal limits. There is no
evidence of hydronephrosis. No renal or ureteral stones are
identified. No perinephric stranding is seen.

Stomach/Bowel: The stomach is unremarkable in appearance. The small
bowel is within normal limits. The appendix is normal in caliber,
without evidence of appendicitis. The colon is unremarkable in
appearance.

Vascular/Lymphatic: Minimal calcification is noted at the distal
abdominal aorta and the right common iliac artery, mildly advanced
for age. No retroperitoneal or pelvic sidewall lymphadenopathy is
seen. Visualized mesenteric nodes are borderline normal in size.

Reproductive: The bladder is mildly distended and grossly
unremarkable. The prostate remains normal in size.

Other: No inguinal hernia is seen. There is mild soft tissue
inflammation at the right inguinal region, with additional focal
edema noted within the anterior soft tissues just superior to the
penile shaft. This raises concern for focal soft tissue injury.

Musculoskeletal: No acute osseous abnormalities are identified. The
visualized musculature is unremarkable in appearance.
IMPRESSION: 1. Mild soft tissue inflammation at the right inguinal region, with
additional focal edema at the anterior soft tissues just superior to
the penile shaft. This raises concern for focal soft tissue injury.
2. No evidence of inguinal hernia.
